# Patient Record
Sex: Female | Born: 1945 | Race: Black or African American | Hispanic: No | Marital: Married | Smoking: Never smoker
Health system: Southern US, Community
[De-identification: ages and names within clinical notes are randomized; demographics above are authoritative.]

## PROBLEM LIST (undated history)

## (undated) DIAGNOSIS — M549 Dorsalgia, unspecified: Secondary | ICD-10-CM

## (undated) DIAGNOSIS — I219 Acute myocardial infarction, unspecified: Secondary | ICD-10-CM

## (undated) DIAGNOSIS — I10 Essential (primary) hypertension: Secondary | ICD-10-CM

## (undated) DIAGNOSIS — T4145XA Adverse effect of unspecified anesthetic, initial encounter: Secondary | ICD-10-CM

## (undated) DIAGNOSIS — I071 Rheumatic tricuspid insufficiency: Secondary | ICD-10-CM

## (undated) DIAGNOSIS — I447 Left bundle-branch block, unspecified: Secondary | ICD-10-CM

## (undated) DIAGNOSIS — I509 Heart failure, unspecified: Secondary | ICD-10-CM

## (undated) DIAGNOSIS — T8859XA Other complications of anesthesia, initial encounter: Secondary | ICD-10-CM

## (undated) DIAGNOSIS — G4730 Sleep apnea, unspecified: Secondary | ICD-10-CM

## (undated) DIAGNOSIS — E119 Type 2 diabetes mellitus without complications: Secondary | ICD-10-CM

## (undated) DIAGNOSIS — I340 Nonrheumatic mitral (valve) insufficiency: Secondary | ICD-10-CM

## (undated) HISTORY — PX: DILATION AND CURETTAGE OF UTERUS: SHX78

## (undated) HISTORY — PX: ABDOMINAL HYSTERECTOMY: SHX81

## (undated) HISTORY — PX: APPENDECTOMY: SHX54

## (undated) HISTORY — PX: TONSILLECTOMY: SUR1361

---

## 2005-12-15 ENCOUNTER — Ambulatory Visit: Payer: Self-pay | Admitting: Internal Medicine

## 2006-02-23 ENCOUNTER — Ambulatory Visit: Payer: Self-pay | Admitting: Internal Medicine

## 2006-03-29 ENCOUNTER — Ambulatory Visit: Payer: Self-pay | Admitting: Family Medicine

## 2008-05-21 ENCOUNTER — Ambulatory Visit: Payer: Self-pay | Admitting: Family Medicine

## 2011-12-14 ENCOUNTER — Ambulatory Visit: Payer: Self-pay | Admitting: Family Medicine

## 2012-10-19 HISTORY — PX: INSERT / REPLACE / REMOVE PACEMAKER: SUR710

## 2014-04-01 ENCOUNTER — Ambulatory Visit: Payer: Self-pay | Admitting: Nephrology

## 2016-08-28 ENCOUNTER — Other Ambulatory Visit: Payer: Self-pay | Admitting: Family Medicine

## 2016-08-28 DIAGNOSIS — Z1231 Encounter for screening mammogram for malignant neoplasm of breast: Secondary | ICD-10-CM

## 2017-10-23 ENCOUNTER — Encounter: Payer: Self-pay | Admitting: *Deleted

## 2017-10-24 ENCOUNTER — Encounter: Payer: Self-pay | Admitting: *Deleted

## 2017-10-24 ENCOUNTER — Ambulatory Visit: Payer: Medicare HMO | Admitting: Anesthesiology

## 2017-10-24 ENCOUNTER — Encounter
Admission: RE | Disposition: A | Discharge status: Home / Self Care | Payer: Self-pay | Source: Ambulatory Visit | Attending: Unknown Physician Specialty

## 2017-10-24 ENCOUNTER — Ambulatory Visit
Admission: RE | Admit: 2017-10-24 | Discharge: 2017-10-24 | Disposition: A | Discharge status: Home / Self Care | Payer: Medicare HMO | Source: Ambulatory Visit | Attending: Unknown Physician Specialty | Admitting: Unknown Physician Specialty

## 2017-10-24 DIAGNOSIS — K5730 Diverticulosis of large intestine without perforation or abscess without bleeding: Secondary | ICD-10-CM | POA: Insufficient documentation

## 2017-10-24 DIAGNOSIS — K640 First degree hemorrhoids: Secondary | ICD-10-CM | POA: Diagnosis not present

## 2017-10-24 DIAGNOSIS — Z794 Long term (current) use of insulin: Secondary | ICD-10-CM | POA: Insufficient documentation

## 2017-10-24 DIAGNOSIS — Z950 Presence of cardiac pacemaker: Secondary | ICD-10-CM | POA: Insufficient documentation

## 2017-10-24 DIAGNOSIS — E119 Type 2 diabetes mellitus without complications: Secondary | ICD-10-CM | POA: Insufficient documentation

## 2017-10-24 DIAGNOSIS — I110 Hypertensive heart disease with heart failure: Secondary | ICD-10-CM | POA: Diagnosis not present

## 2017-10-24 DIAGNOSIS — Z86010 Personal history of colonic polyps: Secondary | ICD-10-CM | POA: Insufficient documentation

## 2017-10-24 DIAGNOSIS — I509 Heart failure, unspecified: Secondary | ICD-10-CM | POA: Insufficient documentation

## 2017-10-24 DIAGNOSIS — I252 Old myocardial infarction: Secondary | ICD-10-CM | POA: Insufficient documentation

## 2017-10-24 DIAGNOSIS — Z79899 Other long term (current) drug therapy: Secondary | ICD-10-CM | POA: Diagnosis not present

## 2017-10-24 DIAGNOSIS — Z1211 Encounter for screening for malignant neoplasm of colon: Secondary | ICD-10-CM | POA: Diagnosis not present

## 2017-10-24 DIAGNOSIS — Z7982 Long term (current) use of aspirin: Secondary | ICD-10-CM | POA: Insufficient documentation

## 2017-10-24 DIAGNOSIS — K621 Rectal polyp: Secondary | ICD-10-CM | POA: Diagnosis not present

## 2017-10-24 DIAGNOSIS — G4730 Sleep apnea, unspecified: Secondary | ICD-10-CM | POA: Diagnosis not present

## 2017-10-24 HISTORY — DX: Essential (primary) hypertension: I10

## 2017-10-24 HISTORY — DX: Other complications of anesthesia, initial encounter: T88.59XA

## 2017-10-24 HISTORY — DX: Sleep apnea, unspecified: G47.30

## 2017-10-24 HISTORY — DX: Rheumatic tricuspid insufficiency: I07.1

## 2017-10-24 HISTORY — DX: Heart failure, unspecified: I50.9

## 2017-10-24 HISTORY — PX: COLONOSCOPY WITH PROPOFOL: SHX5780

## 2017-10-24 HISTORY — DX: Acute myocardial infarction, unspecified: I21.9

## 2017-10-24 HISTORY — DX: Nonrheumatic mitral (valve) insufficiency: I34.0

## 2017-10-24 HISTORY — DX: Adverse effect of unspecified anesthetic, initial encounter: T41.45XA

## 2017-10-24 HISTORY — DX: Left bundle-branch block, unspecified: I44.7

## 2017-10-24 HISTORY — DX: Type 2 diabetes mellitus without complications: E11.9

## 2017-10-24 HISTORY — DX: Dorsalgia, unspecified: M54.9

## 2017-10-24 LAB — GLUCOSE, CAPILLARY: Glucose-Capillary: 131 mg/dL — ABNORMAL HIGH (ref 65–99)

## 2017-10-24 SURGERY — COLONOSCOPY WITH PROPOFOL
Anesthesia: General

## 2017-10-24 MED ORDER — SODIUM CHLORIDE 0.9 % IV SOLN: INTRAVENOUS | Status: DC

## 2017-10-24 MED ORDER — PROPOFOL 10 MG/ML IV BOLUS
INTRAVENOUS | Status: DC | PRN
  Administered 2017-10-24: 100 mg via INTRAVENOUS

## 2017-10-24 MED ORDER — PROPOFOL 10 MG/ML IV BOLUS
INTRAVENOUS | Status: AC
  Filled 2017-10-24: qty 20

## 2017-10-24 MED ORDER — SODIUM CHLORIDE 0.9 % IV SOLN
INTRAVENOUS | Status: DC
  Administered 2017-10-24: 1000 mL via INTRAVENOUS

## 2017-10-24 MED ORDER — SODIUM CHLORIDE 0.9 % IJ SOLN
INTRAMUSCULAR | Status: AC
  Filled 2017-10-24: qty 10

## 2017-10-24 MED ORDER — PROPOFOL 500 MG/50ML IV EMUL
INTRAVENOUS | Status: AC
  Filled 2017-10-24: qty 50

## 2017-10-24 MED ORDER — PHENYLEPHRINE HCL 10 MG/ML IJ SOLN
INTRAMUSCULAR | Status: AC
  Filled 2017-10-24: qty 1

## 2017-10-24 MED ORDER — LIDOCAINE 2% (20 MG/ML) 5 ML SYRINGE
INTRAMUSCULAR | Status: DC | PRN
  Administered 2017-10-24: 30 mg via INTRAVENOUS

## 2017-10-24 MED ORDER — EPHEDRINE SULFATE 50 MG/ML IJ SOLN
INTRAMUSCULAR | Status: AC
  Filled 2017-10-24: qty 1

## 2017-10-24 MED ORDER — ONDANSETRON HCL 4 MG/2ML IJ SOLN
INTRAMUSCULAR | Status: DC | PRN
  Administered 2017-10-24: 4 mg via INTRAVENOUS

## 2017-10-24 MED ORDER — FENTANYL CITRATE (PF) 100 MCG/2ML IJ SOLN
INTRAMUSCULAR | Status: DC | PRN
  Administered 2017-10-24: 50 ug via INTRAVENOUS

## 2017-10-24 MED ORDER — SODIUM CHLORIDE 0.9 % IV SOLN
INTRAVENOUS | Status: DC | PRN
  Administered 2017-10-24: 08:00:00 via INTRAVENOUS

## 2017-10-24 MED ORDER — FENTANYL CITRATE (PF) 100 MCG/2ML IJ SOLN
INTRAMUSCULAR | Status: AC
  Filled 2017-10-24: qty 2

## 2017-10-24 MED ORDER — PROPOFOL 500 MG/50ML IV EMUL
INTRAVENOUS | Status: DC | PRN
  Administered 2017-10-24: 140 ug/kg/min via INTRAVENOUS

## 2017-10-24 MED ORDER — LIDOCAINE HCL (PF) 2 % IJ SOLN
INTRAMUSCULAR | Status: AC
  Filled 2017-10-24: qty 10

## 2017-10-24 MED ORDER — PHENYLEPHRINE HCL 10 MG/ML IJ SOLN
INTRAMUSCULAR | Status: DC | PRN
  Administered 2017-10-24: 100 ug via INTRAVENOUS

## 2017-10-24 NOTE — Anesthesia Post-op Follow-up Note (Signed)
Anesthesia QCDR form completed.        

## 2017-10-24 NOTE — Anesthesia Preprocedure Evaluation (Signed)
Anesthesia Evaluation  Patient identified by MRN, date of birth, ID band Patient awake    Reviewed: Allergy & Precautions, H&P , NPO status , Patient's Chart, lab work & pertinent test results  History of Anesthesia Complications (+) PONV and history of anesthetic complications  Airway Mallampati: III  TM Distance: <3 FB Neck ROM: limited    Dental  (+) Chipped   Pulmonary neg shortness of breath, sleep apnea ,           Cardiovascular Exercise Tolerance: Good hypertension, (-) angina+ Past MI and +CHF  + dysrhythmias + pacemaker      Neuro/Psych negative neurological ROS  negative psych ROS   GI/Hepatic negative GI ROS, Neg liver ROS, neg GERD  ,  Endo/Other  diabetes, Type 2  Renal/GU negative Renal ROS  negative genitourinary   Musculoskeletal   Abdominal   Peds  Hematology negative hematology ROS (+)   Anesthesia Other Findings Past Medical History: No date: Back pain No date: CHF (congestive heart failure) (HCC) No date: Complication of anesthesia     Comment:  postop nausea and vomiting No date: Diabetes mellitus without complication (HCC) No date: Hypertension No date: LBBB (left bundle branch block) No date: Mitral regurgitation No date: Myocardial infarction (HCC) No date: Sleep apnea No date: Tricuspid insufficiency  Past Surgical History: No date: ABDOMINAL HYSTERECTOMY No date: APPENDECTOMY No date: DILATION AND CURETTAGE OF UTERUS     Comment:  fibroids 10/2012: INSERT / REPLACE / REMOVE PACEMAKER     Comment:  ICD No date: TONSILLECTOMY     Reproductive/Obstetrics negative OB ROS                             Anesthesia Physical Anesthesia Plan  ASA: III  Anesthesia Plan: General   Post-op Pain Management:    Induction: Intravenous  PONV Risk Score and Plan: Propofol infusion  Airway Management Planned: Natural Airway and Nasal Cannula  Additional  Equipment:   Intra-op Plan:   Post-operative Plan:   Informed Consent: I have reviewed the patients History and Physical, chart, labs and discussed the procedure including the risks, benefits and alternatives for the proposed anesthesia with the patient or authorized representative who has indicated his/her understanding and acceptance.   Dental Advisory Given  Plan Discussed with: Anesthesiologist, CRNA and Surgeon  Anesthesia Plan Comments: (Patient consented for risks of anesthesia including but not limited to:  - adverse reactions to medications - risk of intubation if required - damage to teeth, lips or other oral mucosa - sore throat or hoarseness - Damage to heart, brain, lungs or loss of life  Patient voiced understanding.)        Anesthesia Quick Evaluation

## 2017-10-24 NOTE — H&P (Signed)
Primary Care Physician:  System, Pcp Not In Primary Gastroenterologist:  Dr. Vira Agar  Pre-Procedure History & Physical: HPI:  Hannah Spencer is a 72 y.o. female is here for an colonoscopy.   Past Medical History:  Diagnosis Date  . Back pain   . CHF (congestive heart failure) ([place])   . Complication of anesthesia    postop nausea and vomiting  . Diabetes mellitus without complication ([place])   . Hypertension   . LBBB (left bundle branch block)   . Mitral regurgitation   . Myocardial infarction ([place])   . Sleep apnea   . Tricuspid insufficiency     Past Surgical History:  Procedure Laterality Date  . ABDOMINAL HYSTERECTOMY    . APPENDECTOMY    . DILATION AND CURETTAGE OF UTERUS     fibroids  . INSERT / REPLACE / REMOVE PACEMAKER  10/2012   ICD  . TONSILLECTOMY      Prior to Admission medications   Medication Sig Start Date End Date Taking? Authorizing Provider  aspirin EC 81 MG tablet Take 81 mg by mouth daily.   Yes [provider]  cetirizine (ZYRTEC) 10 MG tablet Take 10 mg by mouth daily as needed for allergies. As needed   Yes [provider]  Cholecalciferol 2000 units TABS Take 1 tablet by mouth daily.   Yes [provider]  Cranberry 500 MG CAPS Take 1 capsule by mouth daily as needed.   Yes [provider]  ferrous sulfate (KP FERROUS SULFATE) 325 (65 FE) MG tablet Take 325 mg by mouth daily with breakfast.   Yes [provider]  fluticasone (FLONASE) 50 MCG/ACT nasal spray Place 2 sprays into both nostrils daily as needed for allergies or rhinitis.   Yes [provider]  furosemide (LASIX) 20 MG tablet Take 20 mg by mouth daily as needed.   Yes [provider]  Insulin Disposable Pump (V-GO 30) KIT 30 Units by Does not apply route.   Yes [provider]  insulin regular (NOVOLIN R,HUMULIN R) 100 units/mL injection Inject 100 Units into the skin 3 (three) times daily before meals.   Yes  [provider]  losartan (COZAAR) 50 MG tablet Take 50 mg by mouth daily.   Yes [provider]  metFORMIN (GLUCOPHAGE-XR) 500 MG 24 hr tablet Take 1,000 mg by mouth daily. Take at night   Yes [provider]  metoprolol succinate (TOPROL-XL) 50 MG 24 hr tablet Take 50 mg by mouth daily. Take with or immediately following a meal.   Yes [provider]  pravastatin (PRAVACHOL) 40 MG tablet Take 40 mg by mouth at bedtime.   Yes [provider]  ranitidine (ZANTAC) 150 MG capsule Take 150 mg by mouth 2 (two) times daily.   Yes [provider]  spironolactone (ALDACTONE) 25 MG tablet Take 25 mg by mouth daily.   Yes [provider]    Allergies as of 08/06/2017  . (Not on File)    History reviewed. No pertinent family history.  Social History   Socioeconomic History  . Marital status: Married    Spouse name: Not on file  . Number of children: Not on file  . Years of education: Not on file  . Highest education level: Not on file  Social Needs  . Financial resource strain: Not on file  . Food insecurity - worry: Not on file  . Food insecurity - inability: Not on file  . Transportation needs -  medical: Not on file  . Transportation needs - non-medical: Not on file  Occupational History  . Not on file  Tobacco Use  . Smoking status: Never Smoker  . Smokeless tobacco: Never Used  Substance and Sexual Activity  . Alcohol use: No    Frequency: Never  . Drug use: No  . Sexual activity: Not on file  Other Topics Concern  . Not on file  Social History Narrative  . Not on file    Review of Systems: See HPI, otherwise negative ROS  Physical Exam: BP (!) 139/59   Pulse 61   Temp (!) 97.4 F (36.3 C) (Tympanic)   Resp 18   Ht 5' 5.5" (1.664 m)   Wt 72.1 kg (159 lb)   SpO2 100%   BMI 26.06 kg/m  General:   Alert,  pleasant and cooperative in NAD Head:  Normocephalic and atraumatic. Neck:  Supple; no masses or  thyromegaly. Lungs:  Clear throughout to auscultation.    Heart:  Regular rate and rhythm. Abdomen:  Soft, nontender and nondistended. Normal bowel sounds, without guarding, and without rebound.   Neurologic:  Alert and  oriented x4;  grossly normal neurologically. FH colon polyps in mother. Impression/Plan: Hannah Spencer is here for an colonoscopy to be performed for colonoscopy.  Risks, benefits, limitations, and alternatives regarding  colonoscopy have been reviewed with the patient.  Questions have been answered.  All parties agreeable.   Gaylyn Cheers, MD  10/24/2017, 7:42 AM

## 2017-10-24 NOTE — Transfer of Care (Signed)
Immediate Anesthesia Transfer of Care Note  Patient: Hannah LeysBarbara A Baley  Procedure(s) Performed: COLONOSCOPY WITH PROPOFOL (N/A )  Patient Location: PACU and Endoscopy Unit  Anesthesia Type:General  Level of Consciousness: sedated  Airway & Oxygen Therapy: Patient Spontanous Breathing and Patient connected to nasal cannula oxygen  Post-op Assessment: Report given to RN and Post -op Vital signs reviewed and stable  Post vital signs: Reviewed and stable  Last Vitals:  Vitals:   10/24/17 0725  BP: (!) 139/59  Pulse: 61  Resp: 18  Temp: (!) 36.3 C  SpO2: 100%    Last Pain:  Vitals:   10/24/17 0836  TempSrc:   PainSc: Asleep         Complications: No apparent anesthesia complications

## 2017-10-24 NOTE — Anesthesia Postprocedure Evaluation (Signed)
Anesthesia Post Note  Patient: Hannah Spencer  Procedure(s) Performed: COLONOSCOPY WITH PROPOFOL (N/A )  Patient location during evaluation: Endoscopy Anesthesia Type: General Level of consciousness: awake and alert Pain management: pain level controlled Vital Signs Assessment: post-procedure vital signs reviewed and stable Respiratory status: spontaneous breathing, nonlabored ventilation, respiratory function stable and patient connected to nasal cannula oxygen Cardiovascular status: blood pressure returned to baseline and stable Postop Assessment: no apparent nausea or vomiting Anesthetic complications: no     Last Vitals:  Vitals:   10/24/17 0856 10/24/17 0906  BP: (!) 126/53 (!) 116/53  Pulse:    Resp:    Temp:    SpO2:      Last Pain:  Vitals:   10/24/17 0856  TempSrc:   PainSc: 0-No pain                 Cleda MccreedyJoseph K Antoine Fiallos

## 2017-10-24 NOTE — Op Note (Signed)
[facility]enter[facility] Patient Name: Hannah PopperBarbara Kahrs Procedure Date: 10/24/2017 7:32 AM MRN: [id number] Account #: [id number] Date of Birth: 1946-01-22 Admit Type: Outpatient Age: [age] [facility] Gender: Female Note Status: Finalized Procedure:            Colonoscopy Indications:          High risk colon cancer surveillance: Personal history                        of colonic polyps Providers:            Scot Junobert T. Rachel Samples, MD Referring MD:         Marina [place] E. Feldpausch (Referring MD) Medicines:            Propofol per Anesthesia Complications:        No immediate complications. Procedure:            Pre-Anesthesia Assessment:                       - After reviewing the risks and benefits, the patient                        was deemed in satisfactory condition to undergo the                        procedure.                       After obtaining informed consent, the colonoscope was                        passed under direct vision. Throughout the procedure,                        the patient's blood pressure, pulse, and oxygen                        saturations were monitored continuously. The Olympus                        PCF-H180AL colonoscope ( S#: [id number] ) was introduced                        through the anus and advanced to the the cecum,                        identified by appendiceal orifice and ileocecal valve.                        The colonoscopy was performed without difficulty. The                        patient tolerated the procedure well. The quality of                        the bowel preparation was excellent. Findings:      Two sessile polyps were found in the rectum. The polyps were diminutive       in size. These polyps were removed with a jumbo cold forceps. Resection       and retrieval were complete.  Many small-medium-mouthed diverticula were found in the sigmoid colon       and descending colon.      Internal  hemorrhoids were found during endoscopy. The hemorrhoids were       small and Grade I (internal hemorrhoids that do not prolapse).      The exam was otherwise without abnormality. Impression:           - Two diminutive polyps in the rectum, removed with a                        jumbo cold forceps. Resected and retrieved.                       - Diverticulosis in the sigmoid colon and in the                        descending colon.                       - Internal hemorrhoids.                       - The examination was otherwise normal. Recommendation:       - Await pathology results. Scot Jun, MD 10/24/2017 8:35:25 AM This report has been signed electronically. Number of Addenda: 0 Note Initiated On: 10/24/2017 7:32 AM Scope Withdrawal Time: 0 hours 13 minutes 13 seconds  Total Procedure Duration: 0 hours 30 minutes 23 seconds       [facility]

## 2017-10-25 ENCOUNTER — Encounter: Payer: Self-pay | Admitting: Unknown Physician Specialty

## 2017-10-25 LAB — SURGICAL PATHOLOGY

## 2018-09-13 ENCOUNTER — Other Ambulatory Visit: Payer: Self-pay | Admitting: Family Medicine

## 2018-09-13 DIAGNOSIS — Z1231 Encounter for screening mammogram for malignant neoplasm of breast: Secondary | ICD-10-CM

## 2020-03-26 ENCOUNTER — Other Ambulatory Visit: Payer: Self-pay | Admitting: Family Medicine

## 2020-03-26 DIAGNOSIS — Z1231 Encounter for screening mammogram for malignant neoplasm of breast: Secondary | ICD-10-CM

## 2020-03-30 ENCOUNTER — Other Ambulatory Visit: Payer: Self-pay | Admitting: Family Medicine

## 2020-03-30 DIAGNOSIS — R1032 Left lower quadrant pain: Secondary | ICD-10-CM

## 2020-03-30 DIAGNOSIS — I10 Essential (primary) hypertension: Secondary | ICD-10-CM

## 2020-04-09 ENCOUNTER — Encounter (INDEPENDENT_AMBULATORY_CARE_PROVIDER_SITE_OTHER): Payer: Self-pay

## 2020-04-09 ENCOUNTER — Ambulatory Visit
Admission: RE | Admit: 2020-04-09 | Discharge: 2020-04-09 | Disposition: A | Discharge status: Home / Self Care | Payer: Medicare HMO | Source: Ambulatory Visit | Attending: Family Medicine | Admitting: Family Medicine

## 2020-04-09 ENCOUNTER — Other Ambulatory Visit: Payer: Self-pay

## 2020-04-09 DIAGNOSIS — I10 Essential (primary) hypertension: Secondary | ICD-10-CM | POA: Insufficient documentation

## 2020-04-09 DIAGNOSIS — R1032 Left lower quadrant pain: Secondary | ICD-10-CM | POA: Diagnosis not present

## 2020-06-01 ENCOUNTER — Other Ambulatory Visit (INDEPENDENT_AMBULATORY_CARE_PROVIDER_SITE_OTHER): Payer: Self-pay | Admitting: Student

## 2020-06-01 DIAGNOSIS — N261 Atrophy of kidney (terminal): Secondary | ICD-10-CM

## 2020-06-02 ENCOUNTER — Ambulatory Visit (INDEPENDENT_AMBULATORY_CARE_PROVIDER_SITE_OTHER): Payer: Medicare HMO

## 2020-06-02 ENCOUNTER — Other Ambulatory Visit: Payer: Self-pay

## 2020-06-02 DIAGNOSIS — N261 Atrophy of kidney (terminal): Secondary | ICD-10-CM

## 2020-12-06 ENCOUNTER — Ambulatory Visit
Admission: RE | Admit: 2020-12-06 | Discharge: 2020-12-06 | Disposition: A | Discharge status: Home / Self Care | Payer: Medicare HMO | Source: Ambulatory Visit | Attending: Family Medicine | Admitting: Family Medicine

## 2020-12-06 ENCOUNTER — Other Ambulatory Visit: Payer: Self-pay

## 2020-12-06 DIAGNOSIS — Z1231 Encounter for screening mammogram for malignant neoplasm of breast: Secondary | ICD-10-CM | POA: Diagnosis not present

## 2020-12-10 ENCOUNTER — Other Ambulatory Visit: Payer: Self-pay | Admitting: Family Medicine

## 2020-12-10 DIAGNOSIS — R928 Other abnormal and inconclusive findings on diagnostic imaging of breast: Secondary | ICD-10-CM

## 2020-12-10 DIAGNOSIS — N6310 Unspecified lump in the right breast, unspecified quadrant: Secondary | ICD-10-CM

## 2020-12-10 DIAGNOSIS — N6320 Unspecified lump in the left breast, unspecified quadrant: Secondary | ICD-10-CM

## 2020-12-17 ENCOUNTER — Ambulatory Visit
Admission: RE | Admit: 2020-12-17 | Discharge: 2020-12-17 | Disposition: A | Discharge status: Home / Self Care | Payer: Medicare HMO | Source: Ambulatory Visit | Attending: Family Medicine | Admitting: Family Medicine

## 2020-12-17 ENCOUNTER — Other Ambulatory Visit: Payer: Self-pay

## 2020-12-17 DIAGNOSIS — N6310 Unspecified lump in the right breast, unspecified quadrant: Secondary | ICD-10-CM | POA: Insufficient documentation

## 2020-12-17 DIAGNOSIS — N6320 Unspecified lump in the left breast, unspecified quadrant: Secondary | ICD-10-CM

## 2020-12-17 DIAGNOSIS — R928 Other abnormal and inconclusive findings on diagnostic imaging of breast: Secondary | ICD-10-CM | POA: Insufficient documentation

## 2022-02-14 ENCOUNTER — Other Ambulatory Visit: Payer: Self-pay | Admitting: Family Medicine

## 2022-02-14 DIAGNOSIS — Z1231 Encounter for screening mammogram for malignant neoplasm of breast: Secondary | ICD-10-CM

## 2022-02-16 ENCOUNTER — Ambulatory Visit
Admission: RE | Admit: 2022-02-16 | Discharge: 2022-02-16 | Disposition: A | Discharge status: Home / Self Care | Payer: Medicare HMO | Source: Ambulatory Visit | Attending: Family Medicine | Admitting: Family Medicine

## 2022-02-16 DIAGNOSIS — Z1231 Encounter for screening mammogram for malignant neoplasm of breast: Secondary | ICD-10-CM | POA: Insufficient documentation

## 2022-08-12 MED ORDER — GLYCOPYRROLATE 0.2 MG/ML IJ SOLN
INTRAMUSCULAR | Status: AC
  Filled 2022-08-12: qty 1

## 2022-08-12 MED ORDER — FENTANYL CITRATE (PF) 100 MCG/2ML IJ SOLN
INTRAMUSCULAR | Status: AC
  Filled 2022-08-12: qty 2

## 2022-08-12 MED ORDER — PROPOFOL 10 MG/ML IV BOLUS
INTRAVENOUS | Status: AC
  Filled 2022-08-12: qty 20

## 2022-08-12 MED ORDER — LIDOCAINE HCL (PF) 2 % IJ SOLN
INTRAMUSCULAR | Status: AC
  Filled 2022-08-12: qty 5

## 2022-08-15 ENCOUNTER — Encounter: Payer: Self-pay | Admitting: Cardiology

## 2022-08-15 ENCOUNTER — Encounter
Admission: RE | Disposition: A | Discharge status: Home / Self Care | Payer: Self-pay | Source: Home / Self Care | Attending: Cardiology

## 2022-08-15 ENCOUNTER — Other Ambulatory Visit: Payer: Self-pay

## 2022-08-15 ENCOUNTER — Ambulatory Visit
Admission: RE | Admit: 2022-08-15 | Discharge: 2022-08-15 | Disposition: A | Discharge status: Home / Self Care | Payer: Medicare HMO | Attending: Cardiology | Admitting: Cardiology

## 2022-08-15 DIAGNOSIS — Z45010 Encounter for checking and testing of cardiac pacemaker pulse generator [battery]: Secondary | ICD-10-CM

## 2022-08-15 DIAGNOSIS — Z4502 Encounter for adjustment and management of automatic implantable cardiac defibrillator: Secondary | ICD-10-CM | POA: Insufficient documentation

## 2022-08-15 DIAGNOSIS — Z01818 Encounter for other preprocedural examination: Secondary | ICD-10-CM

## 2022-08-15 HISTORY — PX: ICD GENERATOR CHANGEOUT: EP1231

## 2022-08-15 LAB — GLUCOSE, CAPILLARY: Glucose-Capillary: 195 mg/dL — ABNORMAL HIGH (ref 70–99)

## 2022-08-15 SURGERY — ICD GENERATOR CHANGEOUT
Anesthesia: Moderate Sedation

## 2022-08-15 MED ORDER — ONDANSETRON HCL 4 MG/2ML IJ SOLN: 4.0000 mg | Freq: Four times a day (QID) | INTRAMUSCULAR | Status: DC | PRN

## 2022-08-15 MED ORDER — VANCOMYCIN HCL IN DEXTROSE 1-5 GM/200ML-% IV SOLN
1000.0000 mg | INTRAVENOUS | Status: AC
  Administered 2022-08-15: 1000 mg via INTRAVENOUS

## 2022-08-15 MED ORDER — FENTANYL CITRATE (PF) 100 MCG/2ML IJ SOLN
INTRAMUSCULAR | Status: DC | PRN
  Administered 2022-08-15: 25 ug via INTRAVENOUS

## 2022-08-15 MED ORDER — CHLORHEXIDINE GLUCONATE 4 % EX LIQD: 4.0000 | Freq: Once | CUTANEOUS | Status: DC

## 2022-08-15 MED ORDER — CLARITHROMYCIN ER 500 MG PO TB24: 1000.0000 mg | ORAL_TABLET | Freq: Every day | ORAL | 0 refills | Status: AC

## 2022-08-15 MED ORDER — VANCOMYCIN HCL IN DEXTROSE 1-5 GM/200ML-% IV SOLN
INTRAVENOUS | Status: AC
  Filled 2022-08-15: qty 200

## 2022-08-15 MED ORDER — SODIUM CHLORIDE 0.9 % IV SOLN: INTRAVENOUS | Status: DC

## 2022-08-15 MED ORDER — CHLORHEXIDINE GLUCONATE CLOTH 2 % EX PADS: 6.0000 | MEDICATED_PAD | Freq: Every day | CUTANEOUS | Status: DC

## 2022-08-15 MED ORDER — SODIUM CHLORIDE 0.9 % IV SOLN
80.0000 mg | INTRAVENOUS | Status: AC
  Administered 2022-08-15: 80 mg
  Filled 2022-08-15 (×2): qty 2

## 2022-08-15 MED ORDER — LIDOCAINE HCL (PF) 1 % IJ SOLN
INTRAMUSCULAR | Status: DC | PRN
  Administered 2022-08-15: 30 mL

## 2022-08-15 MED ORDER — MIDAZOLAM HCL 2 MG/2ML IJ SOLN
INTRAMUSCULAR | Status: AC
  Filled 2022-08-15: qty 2

## 2022-08-15 MED ORDER — FENTANYL CITRATE (PF) 100 MCG/2ML IJ SOLN
INTRAMUSCULAR | Status: AC
  Filled 2022-08-15: qty 2

## 2022-08-15 MED ORDER — LIDOCAINE HCL 1 % IJ SOLN
INTRAMUSCULAR | Status: AC
  Filled 2022-08-15: qty 40

## 2022-08-15 MED ORDER — MIDAZOLAM HCL 2 MG/2ML IJ SOLN
INTRAMUSCULAR | Status: DC | PRN
  Administered 2022-08-15: 1 mg via INTRAVENOUS

## 2022-08-15 MED ORDER — ACETAMINOPHEN 325 MG PO TABS: 325.0000 mg | ORAL_TABLET | ORAL | Status: DC | PRN

## 2022-08-15 SURGICAL SUPPLY — 18 items
COVER EZ STRL 42X30 (DRAPES) IMPLANT
COVER SURGICAL LIGHT HANDLE (MISCELLANEOUS) IMPLANT
DEVICE DSSCT PLSMBLD 3.0S LGHT (MISCELLANEOUS) IMPLANT
DRAPE INCISE 23X17 IOBAN STRL (DRAPES) ×1
DRAPE INCISE 23X17 STRL (DRAPES) IMPLANT
DRAPE INCISE IOBAN 23X17 STRL (DRAPES) ×1 IMPLANT
ICD CLARIA MRI DTMA1D4 (ICD Generator) IMPLANT
PAD ELECT DEFIB RADIOL ZOLL (MISCELLANEOUS) IMPLANT
PLASMABLADE 3.0S W/LIGHT (MISCELLANEOUS) ×1
POUCH AIGIS-R ANTIBACT ICD (Mesh General) ×1 IMPLANT
POUCH AIGIS-R ANTIBACT ICD LRG (Mesh General) IMPLANT
SUT VIC AB 2-0 CT1 (SUTURE) IMPLANT
SUT VIC AB 2-0 CT1 27 (SUTURE) ×1
SUT VIC AB 2-0 CT1 TAPERPNT 27 (SUTURE) IMPLANT
SUT VICRYL 4-0  27 PS-2 BARIAT (SUTURE) ×1
SUT VICRYL 4-0 27 PS-2 BARIAT (SUTURE) ×1
SUTURE VICRYL 4-0 27 PS-2 BART (SUTURE) IMPLANT
TRAY PACEMAKER INSERTION (PACKS) ×1 IMPLANT

## 2022-08-15 NOTE — Discharge Instructions (Signed)
Patient may shower 08/17/2022.  Outer bandage may be removed, leave Steri-Strips on.

## 2023-10-06 IMAGING — MG MM DIGITAL SCREENING BILAT W/ TOMO AND CAD
6 of 10 series · 6 of 30 positions shown · non-contrast
Comparison: Previous exam(s).

CLINICAL DATA: Screening.

EXAM:
DIGITAL SCREENING BILATERAL MAMMOGRAM WITH TOMOSYNTHESIS AND CAD
TECHNIQUE: Bilateral screening digital craniocaudal and mediolateral oblique
mammograms were obtained. Bilateral screening digital breast
tomosynthesis was performed. The images were evaluated with
computer-aided detection.

[L CC synth-2D]
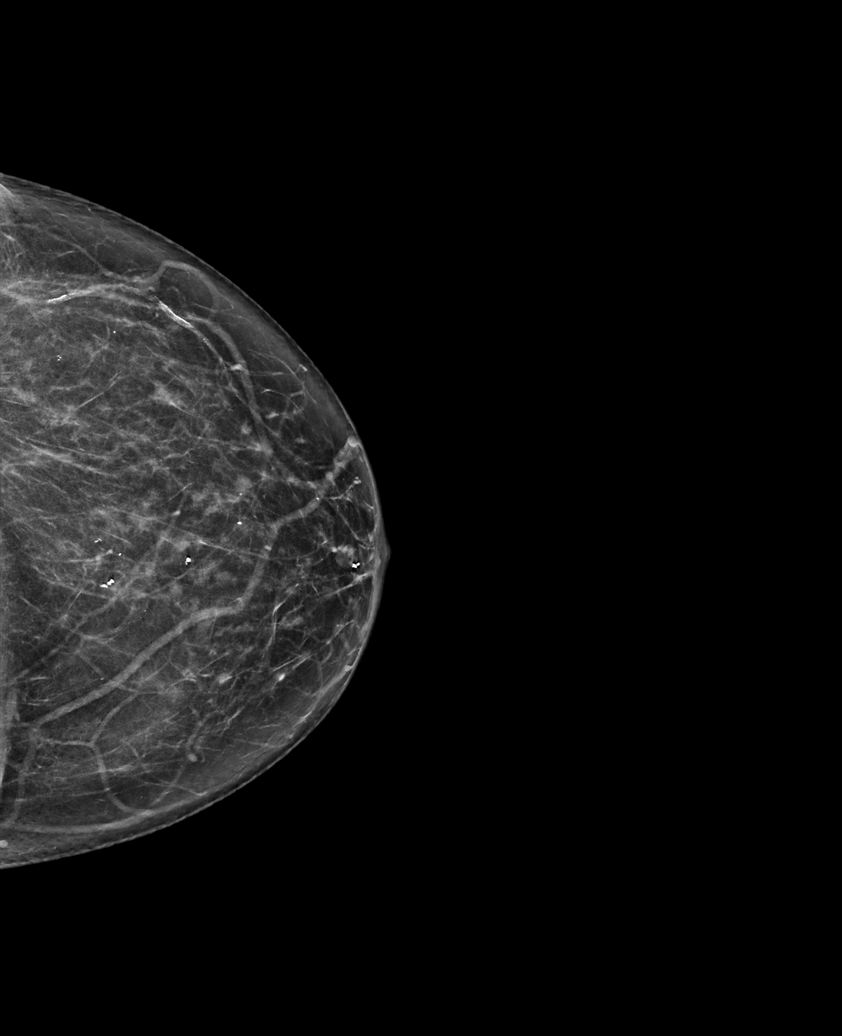

[R MLO synth-2D]
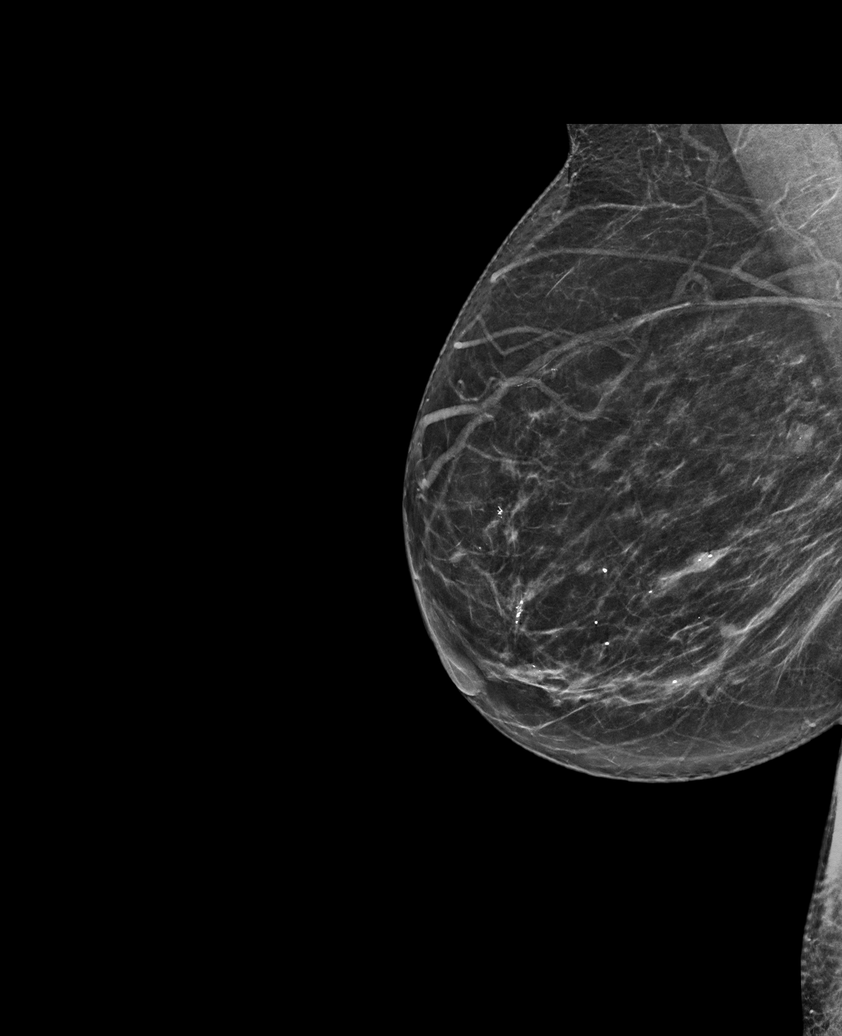

[R CC synth-2D]
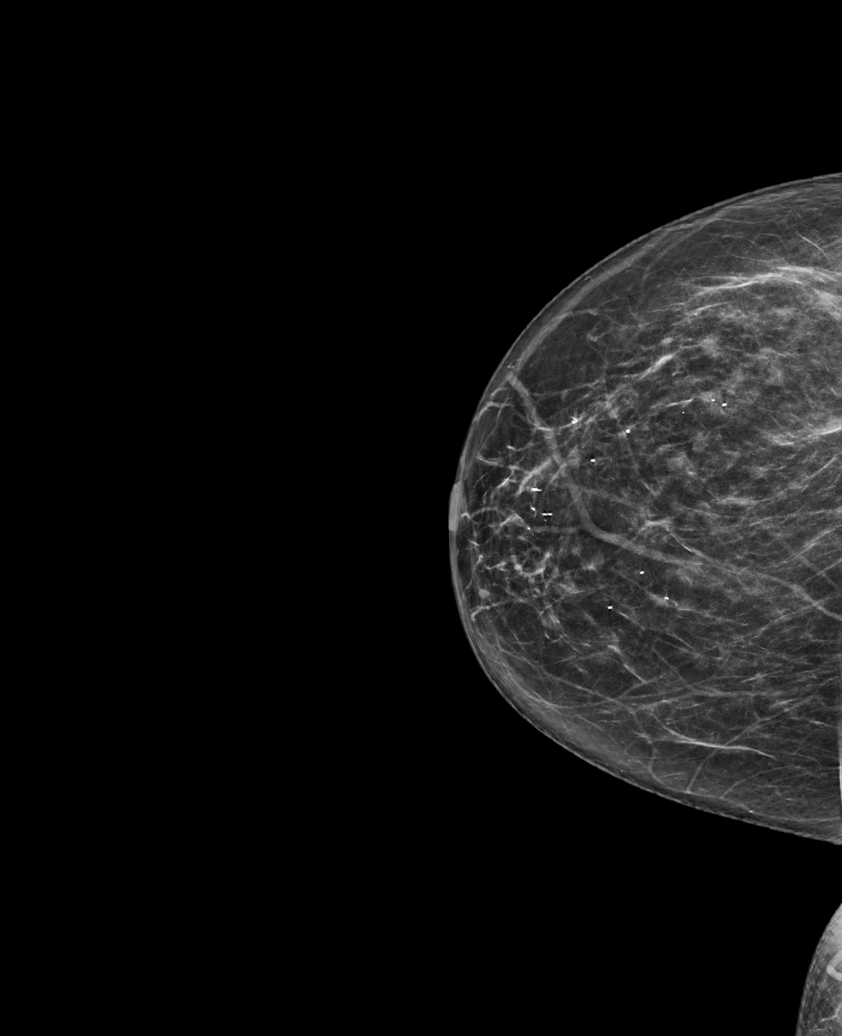

[L MLO synth-2D (1 of 2)]
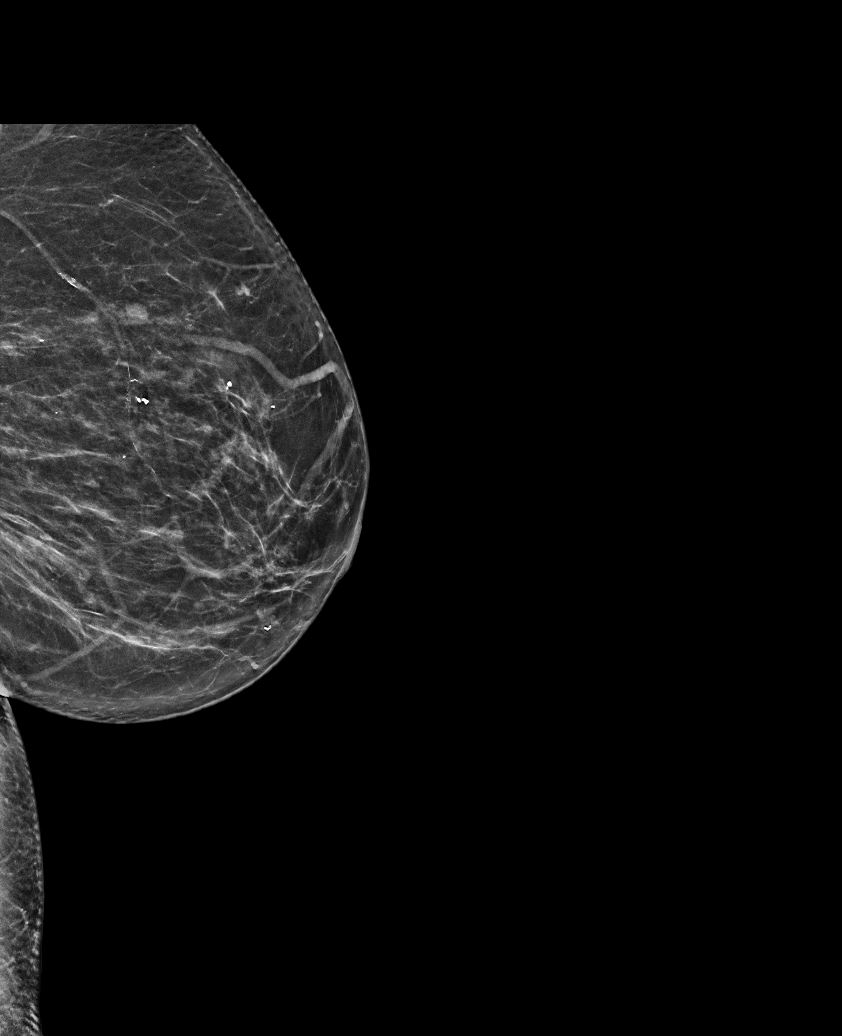

[L MLO synth-2D (2 of 2)]
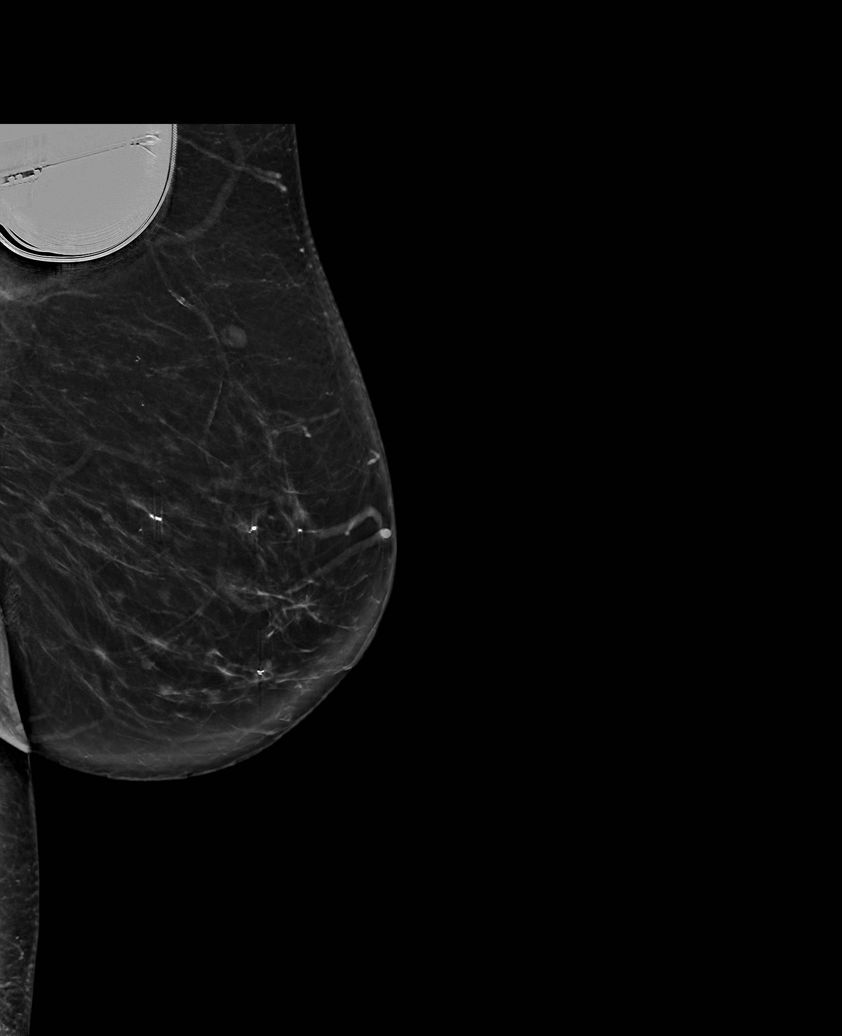

[R MLO tomo · tomo slice 35/68.0]
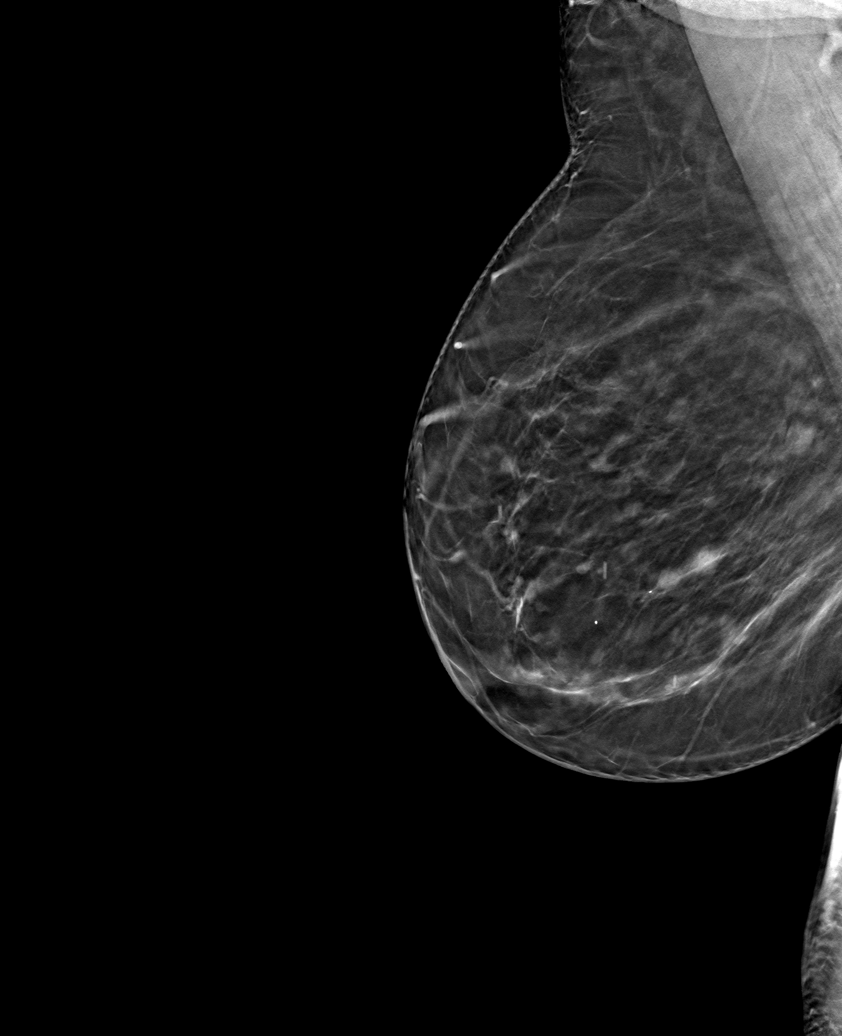

[6 of 30 positions shown; findings below may reference images not displayed]

ACR Breast Density Category c: The breast tissue is heterogeneously
dense, which may obscure small masses.
FINDINGS: There are no findings suspicious for malignancy.
IMPRESSION: No mammographic evidence of malignancy. A result letter of this
screening mammogram will be mailed directly to the patient.

RECOMMENDATION:
Screening mammogram in one year. (Code:Q3-W-BC3)

BI-RADS CATEGORY  1: Negative.
